# Patient Record
Sex: Female | Born: 1990 | Race: White | Hispanic: No | Marital: Single | Smoking: Never smoker
Health system: Southern US, Community
[De-identification: ages and names within clinical notes are randomized; demographics above are authoritative.]

## PROBLEM LIST (undated history)

## (undated) DIAGNOSIS — N3090 Cystitis, unspecified without hematuria: Secondary | ICD-10-CM

## (undated) DIAGNOSIS — F329 Major depressive disorder, single episode, unspecified: Secondary | ICD-10-CM

## (undated) DIAGNOSIS — F32A Depression, unspecified: Secondary | ICD-10-CM

## (undated) DIAGNOSIS — G43909 Migraine, unspecified, not intractable, without status migrainosus: Secondary | ICD-10-CM

## (undated) HISTORY — PX: APPENDECTOMY: SHX54

---

## 2009-11-12 ENCOUNTER — Emergency Department: Payer: Self-pay | Admitting: Emergency Medicine

## 2011-09-18 ENCOUNTER — Emergency Department: Payer: Self-pay | Admitting: *Deleted

## 2016-04-02 ENCOUNTER — Encounter (HOSPITAL_COMMUNITY): Payer: Self-pay

## 2016-04-02 ENCOUNTER — Emergency Department (HOSPITAL_COMMUNITY)
Admission: EM | Admit: 2016-04-02 | Discharge: 2016-04-02 | Disposition: A | Discharge status: Home / Self Care | Payer: BLUE CROSS/BLUE SHIELD | Attending: Emergency Medicine | Admitting: Emergency Medicine

## 2016-04-02 DIAGNOSIS — R51 Headache: Secondary | ICD-10-CM | POA: Diagnosis not present

## 2016-04-02 DIAGNOSIS — G43909 Migraine, unspecified, not intractable, without status migrainosus: Secondary | ICD-10-CM | POA: Diagnosis present

## 2016-04-02 DIAGNOSIS — R519 Headache, unspecified: Secondary | ICD-10-CM

## 2016-04-02 HISTORY — DX: Migraine, unspecified, not intractable, without status migrainosus: G43.909

## 2016-04-02 LAB — POC URINE PREG, ED: PREG TEST UR: NEGATIVE

## 2016-04-02 MED ORDER — DIPHENHYDRAMINE HCL 50 MG/ML IJ SOLN
25.0000 mg | Freq: Once | INTRAMUSCULAR | Status: AC
  Administered 2016-04-02: 25 mg via INTRAVENOUS
  Filled 2016-04-02: qty 1

## 2016-04-02 MED ORDER — METOCLOPRAMIDE HCL 5 MG/ML IJ SOLN
10.0000 mg | Freq: Once | INTRAMUSCULAR | Status: AC
  Administered 2016-04-02: 10 mg via INTRAVENOUS
  Filled 2016-04-02: qty 2

## 2016-04-02 MED ORDER — SODIUM CHLORIDE 0.9 % IV BOLUS (SEPSIS)
1000.0000 mL | Freq: Once | INTRAVENOUS | Status: AC
  Administered 2016-04-02: 1000 mL via INTRAVENOUS

## 2016-04-02 NOTE — ED Notes (Signed)
MD at bedside. 

## 2016-04-02 NOTE — ED Notes (Signed)
Patient here with complaint of migraine headache x 3 hours and nausea that started 1 hour ago. Hx of same. Denies trauma. Alert and oriented. Has some etoh last pm and thinks that may have precipitated the headache

## 2016-04-02 NOTE — ED Provider Notes (Signed)
CSN: [id number]     Arrival date & time 04/02/16  1558 History   First MD Initiated Contact with Patient 04/02/16 1700     Chief Complaint  Patient presents with  . Migraine    HPI Comments: Bl pulsatile throbbing frontal. Gradual in onset. Thinks she had an aura, knew she would  Has had migraines before.  +lightheaded.  No vertigo.  +photophobia and sound bothersome.  No fevers.  No neck stiffness.   Patient is a 25 y.o. female presenting with migraines and headaches. The history is provided by the patient.  Migraine This is a recurrent problem. The current episode started today (3 hrs ago). Associated symptoms include anorexia, headaches and vomiting (nausea resolving). Pertinent negatives include no abdominal pain, chest pain, chills, coughing, diaphoresis, fatigue, fever, nausea, neck pain, numbness, rash or visual change.  Headache Severity currently:  3/10 Severity at highest:  8/10 Onset quality:  Gradual Progression:  Improving Chronicity:  Recurrent Similar to prior headaches: yes   Associated symptoms: photophobia and vomiting (nausea resolving)   Associated symptoms: no abdominal pain, no back pain, no blurred vision, no cough, no diarrhea, no dizziness, no facial pain, no fatigue, no fever, no focal weakness, no nausea, no near-syncope, no neck pain, no neck stiffness, no numbness, no syncope, no tingling and no visual change    Had ETOH use last night.   Eats a lot of cheese.   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  No chocolate.  No trauma.   Has had 4 migraines in last year, 2 in last month.  More frequent panic attacks.   On estrogen OCP.     Past Medical History  Diagnosis Date  . Migraine    History reviewed. No pertinent past surgical history. No family history on file. Social History  Substance Use Topics  . Smoking status: Never Smoker   . Smokeless tobacco: None  . Alcohol Use: None   OB History    No data available     Review of Systems  Constitutional:  Negative for fever, chills, diaphoresis and fatigue.  Eyes: Positive for photophobia. Negative for blurred vision.  Respiratory: Negative for cough, shortness of breath and wheezing.   Cardiovascular: Negative for chest pain, syncope and near-syncope.  Gastrointestinal: Positive for vomiting (nausea resolving) and anorexia. Negative for nausea, abdominal pain and diarrhea.  Musculoskeletal: Negative for back pain, neck pain and neck stiffness.  Skin: Negative for rash.  Neurological: Positive for light-headedness and headaches. Negative for dizziness, focal weakness, speech difficulty and numbness.  All other systems reviewed and are negative.     Allergies  Review of patient's allergies indicates no known allergies.  Home Medications   Prior to Admission medications   Not on File   BP 108/72 mmHg  Pulse 68  Temp(Src) 98.2 F (36.8 C) (Oral)  Resp 18  Ht 5\' 8"  (1.727 m)  Wt 61.236 kg  BMI 20.53 kg/m2  SpO2 98%  LMP 03/26/2016 Physical Exam  Constitutional: She appears well-developed and well-nourished. No distress.  HENT:  Head: Normocephalic and atraumatic.  Nose: Nose normal.  Eyes: Conjunctivae are normal. Pupils are equal, round, and reactive to light.  Neck: Normal range of motion. Neck supple. No tracheal deviation present.  No nuchal rigidity  Cardiovascular: Normal rate, regular rhythm, normal heart sounds and intact distal pulses.   No murmur heard. Pulmonary/Chest: Effort normal and breath sounds normal. No respiratory distress. She has no rales.  Abdominal: Soft. Bowel sounds are normal. She exhibits  no distension and no mass. There is no tenderness. There is no guarding.  Musculoskeletal: Normal range of motion. She exhibits no edema or tenderness.  Neurological:  Alert and oriented x3. CN 3-12 tested and without deficit. 5/5 muscle strength in all extremities with flexion and extension.  Normal bulk and tone.  No sensory deficit to light touch.  Tandem gait  normal. Neg Brudzinski and Kernigs sign.  Symmetric and equal 2+ patellar and brachioradialis DTRs.  No pronator drift.  Normal heel-to-shin and finger-to-nose.  Toes flexor bilaterally.   Skin: Skin is warm. No rash noted.  Psychiatric: She has a normal mood and affect.    ED Course  Procedures (including critical care time) Labs Review Labs Reviewed  POC URINE PREG, ED    Imaging Review No results found. I have personally reviewed and evaluated these images and lab results as part of my medical decision-making.   EKG Interpretation None      MDM   Final diagnoses:  Nonintractable headache, unspecified chronicity pattern, unspecified headache type   Hx of migraines recently, has lots of food risk factors.  Neuro exam normal.  Doubt SAH, gradual onset, nml neuro exam. No fever, no rigidity, doubt meningitis.  Considered dural venous thrombosis given OCP but exam without any neuro deficit and HA improving.  Not sinusitis.   Not preg  Migraine cocktail resolved sx.  Remained very well appearing.  Counseled on strict return precautions.    Sofie Rower, MD 04/03/16 8119  Marily Memos, MD 04/03/16 [phone]

## 2017-02-21 ENCOUNTER — Other Ambulatory Visit: Payer: Self-pay | Admitting: Chiropractic Medicine

## 2017-02-21 DIAGNOSIS — R102 Pelvic and perineal pain: Secondary | ICD-10-CM

## 2017-03-01 ENCOUNTER — Ambulatory Visit
Admission: RE | Admit: 2017-03-01 | Discharge: 2017-03-01 | Disposition: A | Discharge status: Home / Self Care | Payer: BLUE CROSS/BLUE SHIELD | Source: Ambulatory Visit | Attending: Chiropractic Medicine | Admitting: Chiropractic Medicine

## 2017-03-01 DIAGNOSIS — R102 Pelvic and perineal pain: Secondary | ICD-10-CM

## 2017-04-18 ENCOUNTER — Ambulatory Visit (HOSPITAL_COMMUNITY)
Admission: EM | Admit: 2017-04-18 | Discharge: 2017-04-18 | Disposition: A | Discharge status: Home / Self Care | Payer: BLUE CROSS/BLUE SHIELD | Attending: Family Medicine | Admitting: Family Medicine

## 2017-04-18 ENCOUNTER — Encounter (HOSPITAL_COMMUNITY): Payer: Self-pay | Admitting: Emergency Medicine

## 2017-04-18 DIAGNOSIS — R300 Dysuria: Secondary | ICD-10-CM | POA: Insufficient documentation

## 2017-04-18 DIAGNOSIS — G43909 Migraine, unspecified, not intractable, without status migrainosus: Secondary | ICD-10-CM | POA: Insufficient documentation

## 2017-04-18 DIAGNOSIS — F329 Major depressive disorder, single episode, unspecified: Secondary | ICD-10-CM | POA: Insufficient documentation

## 2017-04-18 DIAGNOSIS — B373 Candidiasis of vulva and vagina: Secondary | ICD-10-CM | POA: Diagnosis not present

## 2017-04-18 DIAGNOSIS — B3731 Acute candidiasis of vulva and vagina: Secondary | ICD-10-CM

## 2017-04-18 DIAGNOSIS — N898 Other specified noninflammatory disorders of vagina: Secondary | ICD-10-CM | POA: Diagnosis present

## 2017-04-18 DIAGNOSIS — L298 Other pruritus: Secondary | ICD-10-CM | POA: Diagnosis present

## 2017-04-18 HISTORY — DX: Major depressive disorder, single episode, unspecified: F32.9

## 2017-04-18 HISTORY — DX: Depression, unspecified: F32.A

## 2017-04-18 LAB — POCT URINALYSIS DIP (DEVICE)
GLUCOSE, UA: NEGATIVE mg/dL
Ketones, ur: 15 mg/dL — AB
LEUKOCYTES UA: NEGATIVE
Nitrite: NEGATIVE
Protein, ur: 100 mg/dL — AB
SPECIFIC GRAVITY, URINE: 1.02 (ref 1.005–1.030)
UROBILINOGEN UA: 0.2 mg/dL (ref 0.0–1.0)
pH: 6.5 (ref 5.0–8.0)

## 2017-04-18 MED ORDER — FLUCONAZOLE 200 MG PO TABS: ORAL_TABLET | ORAL | 0 refills | Status: AC

## 2017-04-18 NOTE — Discharge Instructions (Signed)
Treating for yeast vaginitis, prescribed Diflucan, take one tablet today, wait 3 days and then take the second tablet. If symptoms persist return to clinic for further evaluation and management, or follow-up with her primary care provider.

## 2017-04-18 NOTE — ED Provider Notes (Signed)
CSN: [id number]     Arrival date & time 04/18/17  1020 History   First MD Initiated Contact with Patient 04/18/17 1137     Chief Complaint  Patient presents with  . Vaginitis   (Consider location/radiation/quality/duration/timing/severity/associated sxs/prior Treatment) The history is provided by the patient.  Vaginal Discharge  Quality:  Thick ("chunky") Severity:  Moderate Onset quality:  Gradual Duration:  3 days Timing:  Constant Progression:  Worsening Chronicity:  Recurrent Context: after urination and during urination   Context: not during intercourse and not recent antibiotic use   Relieved by:  None tried Ineffective treatments:  None tried Associated symptoms: dysuria and vaginal itching   Associated symptoms: no abdominal pain, no dyspareunia, no fever, no genital lesions, no nausea and no vomiting   Risk factors: unprotected sex   Risk factors: no new sexual partner, no STI and no STI exposure     Past Medical History:  Diagnosis Date  . Depression   . Migraine    History reviewed. No pertinent surgical history. History reviewed. No pertinent family history. Social History  Substance Use Topics  . Smoking status: Never Smoker  . Smokeless tobacco: Never Used  . Alcohol use Yes     Comment: occasional   OB History    No data available     Review of Systems  Constitutional: Negative for chills and fever.  HENT: Negative.   Respiratory: Negative.   Cardiovascular: Negative.   Gastrointestinal: Negative for abdominal pain, nausea and vomiting.  Genitourinary: Positive for dysuria and vaginal discharge. Negative for dyspareunia, frequency and pelvic pain.  Musculoskeletal: Negative.   Skin: Negative.   Neurological: Negative.     Allergies  Patient has no known allergies.  Home Medications   Prior to Admission medications   Medication Sig Start Date End Date Taking? Authorizing Provider  buPROPion (WELLBUTRIN XL) 300 MG 24 hr tablet Take 300 mg  by mouth daily.   Yes [provider]  drospirenone-ethinyl estradiol (YAZ,GIANVI,LORYNA) 3-0.02 MG tablet Take 1 tablet by mouth daily.   Yes [provider]  escitalopram (LEXAPRO) 20 MG tablet Take 20 mg by mouth daily.   Yes [provider]  fluconazole (DIFLUCAN) 200 MG tablet Take one tablet today, wait 3 days, take the second tablet 04/18/17   Dorena BodoKennard, Presly Steinruck, NP   Meds Ordered and Administered this Visit  Medications - No data to display  BP (!) 110/92 (BP Location: Right Arm)   Pulse 78   Temp 97.7 F (36.5 C) (Oral)   LMP 04/17/2017 (Exact Date)   SpO2 100%  No data found.   Physical Exam  Constitutional: She is oriented to person, place, and time. She appears well-developed and well-nourished. No distress.  HENT:  Head: Normocephalic and atraumatic.  Eyes: Conjunctivae are normal.  Neck: Normal range of motion.  Abdominal: Soft. There is no tenderness. There is no guarding.  Genitourinary:  Genitourinary Comments: Deferred at Pt request, urine cytology obtained  Neurological: She is alert and oriented to person, place, and time.  Skin: Skin is warm and dry. Capillary refill takes less than 2 seconds. She is not diaphoretic.  Psychiatric: She has a normal mood and affect. Her behavior is normal.  Nursing note and vitals reviewed.   Urgent Care Course     Procedures (including critical care time)  Labs Review Labs Reviewed  POCT URINALYSIS DIP (DEVICE) - Abnormal; Notable for the following:       Result Value   Bilirubin Urine SMALL (*)  Ketones, ur 15 (*)    Hgb urine dipstick TRACE (*)    Protein, ur 100 (*)    All other components within normal limits  URINE CYTOLOGY ANCILLARY ONLY    Imaging Review No results found.      MDM   1. Yeast vaginitis    Given prescription for Diflucan, urine cytology obtained and sent for testing. Return to clinic as necessary.    Dorena Bodo, NP 04/18/17 [phone]

## 2017-04-18 NOTE — ED Triage Notes (Signed)
Pt reports vaginal itching, burning, and discomfort along with clumpy white discharge for the last two days.  Pt states she gets yeast infections every few years with these same symptoms.

## 2017-04-19 LAB — URINE CYTOLOGY ANCILLARY ONLY
Chlamydia: NEGATIVE
NEISSERIA GONORRHEA: NEGATIVE
TRICH (WINDOWPATH): NEGATIVE

## 2017-04-23 LAB — URINE CYTOLOGY ANCILLARY ONLY
Bacterial vaginitis: NEGATIVE
Candida vaginitis: NEGATIVE

## 2017-05-01 ENCOUNTER — Ambulatory Visit (HOSPITAL_COMMUNITY)
Admission: EM | Admit: 2017-05-01 | Discharge: 2017-05-01 | Disposition: A | Discharge status: Home / Self Care | Payer: BLUE CROSS/BLUE SHIELD | Attending: Family Medicine | Admitting: Family Medicine

## 2017-05-01 ENCOUNTER — Encounter (HOSPITAL_COMMUNITY): Payer: Self-pay | Admitting: Family Medicine

## 2017-05-01 DIAGNOSIS — N3090 Cystitis, unspecified without hematuria: Secondary | ICD-10-CM | POA: Diagnosis not present

## 2017-05-01 HISTORY — DX: Cystitis, unspecified without hematuria: N30.90

## 2017-05-01 LAB — POCT URINALYSIS DIP (DEVICE)
Bilirubin Urine: NEGATIVE
Glucose, UA: NEGATIVE mg/dL
HGB URINE DIPSTICK: NEGATIVE
KETONES UR: NEGATIVE mg/dL
Nitrite: NEGATIVE
PH: 7 (ref 5.0–8.0)
PROTEIN: 30 mg/dL — AB
SPECIFIC GRAVITY, URINE: 1.015 (ref 1.005–1.030)
Urobilinogen, UA: 0.2 mg/dL (ref 0.0–1.0)

## 2017-05-01 MED ORDER — CIPROFLOXACIN HCL 500 MG PO TABS: 500.0000 mg | ORAL_TABLET | Freq: Two times a day (BID) | ORAL | 0 refills | Status: AC

## 2017-05-01 NOTE — ED Triage Notes (Signed)
Woke   Up      Symptoms    Fullness      Frequency  Scanty  Output     Of urine   Has  Had   uti  In  Past

## 2017-05-01 NOTE — ED Provider Notes (Signed)
[facility]    CSN: [id number] Arrival date & time: 05/01/17  1113     History   Chief Complaint Chief Complaint  Patient presents with  . Urinary Tract Infection    HPI Kathleen Ayers is a 26 y.o. female.   This 26 year old woman comes to the urgent care center complaining of symptoms she attributes to a urinary tract infection.  Patient has a history of inflammatory cystitis in the past which she was treated for an largely resolve. She still gets an occasional urinary infection and when these symptoms occur, she probably seeks help because she knows it'll get worse rapidly.  She had a yeast infection a couple weeks ago and it was successfully treated. She is not married but she is sexually active. She's nulliparous.      Past Medical History:  Diagnosis Date  . Cystitis   . Depression   . Migraine     There are no active problems to display for this patient.   Past Surgical History:  Procedure Laterality Date  . APPENDECTOMY      OB History    No data available       Home Medications    Prior to Admission medications   Medication Sig Start Date End Date Taking? Authorizing Provider  buPROPion (WELLBUTRIN XL) 300 MG 24 hr tablet Take 300 mg by mouth daily.    [provider]  ciprofloxacin (CIPRO) 500 MG tablet Take 1 tablet (500 mg total) by mouth 2 (two) times daily. 05/01/17   Elvina Sidle, MD  drospirenone-ethinyl estradiol Pierre Bali) 3-0.02 MG tablet Take 1 tablet by mouth daily.    [provider]  escitalopram (LEXAPRO) 20 MG tablet Take 20 mg by mouth daily.    [provider]  fluconazole (DIFLUCAN) 200 MG tablet Take one tablet today, wait 3 days, take the second tablet 04/18/17   Dorena Bodo, NP    Family History History reviewed. No pertinent family history.  Social History Social History  Substance Use Topics  . Smoking status: Never Smoker  . Smokeless tobacco: Never Used  .  Alcohol use Yes     Comment: occasional     Allergies   Patient has no known allergies.   Review of Systems Review of Systems  Genitourinary: Positive for dysuria and frequency.     Physical Exam Triage Vital Signs ED Triage Vitals  Enc Vitals Group     BP      Pulse      Resp      Temp      Temp src      SpO2      Weight      Height      Head Circumference      Peak Flow      Pain Score      Pain Loc      Pain Edu?      Excl. in GC?    No data found.   Updated Vital Signs BP 110/70 (BP Location: Right Arm)   Pulse 78   Temp 98.6 F (37 C) (Oral)   Resp 18   LMP 04/15/2017   SpO2 100%      Physical Exam  Constitutional: She is oriented to person, place, and time. She appears well-developed and well-nourished.  HENT:  Right Ear: External ear normal.  Left Ear: External ear normal.  Mouth/Throat: Oropharynx is clear and moist.  Eyes: Conjunctivae and EOM are normal. Pupils are equal, round,  and reactive to light.  Neck: Normal range of motion. Neck supple.  Pulmonary/Chest: Effort normal.  Musculoskeletal: Normal range of motion.  Neurological: She is alert and oriented to person, place, and time.  Skin: Skin is warm and dry.  Psychiatric: She has a normal mood and affect. Her behavior is normal. Thought content normal.  Nursing note and vitals reviewed.    UC Treatments / Results  Labs (all labs ordered are listed, but only abnormal results are displayed) Labs Reviewed  POCT URINALYSIS DIP (DEVICE) - Abnormal; Notable for the following:       Result Value   Protein, ur 30 (*)    Leukocytes, UA MODERATE (*)    All other components within normal limits    EKG  EKG Interpretation None       Radiology No results found.  Procedures Procedures (including critical care time)  Medications Ordered in UC Medications - No data to display   Initial Impression / Assessment and Plan / UC Course  I have reviewed the triage vital signs and  the nursing notes.  Pertinent labs & imaging results that were available during my care of the patient were reviewed by me and considered in my medical decision making (see chart for details).     Final Clinical Impressions(s) / UC Diagnoses   Final diagnoses:  Cystitis    New Prescriptions New Prescriptions   CIPROFLOXACIN (CIPRO) 500 MG TABLET    Take 1 tablet (500 mg total) by mouth 2 (two) times daily.     Elvina SidleLauenstein, Geddy Boydstun, MD 05/01/17 1215

## 2019-01-31 IMAGING — US US PELVIS COMPLETE
1 series · 14 of 25 positions shown · non-contrast
Comparison: None

CLINICAL DATA: Chronic severe dysmenorrhea.

EXAM:
TRANSABDOMINAL AND TRANSVAGINAL ULTRASOUND OF PELVIS
TECHNIQUE: Both transabdominal and transvaginal ultrasound examinations of the
pelvis were performed. Transabdominal technique was performed for
global imaging of the pelvis including uterus, ovaries, adnexal
regions, and pelvic cul-de-sac. It was necessary to proceed with
endovaginal exam following the transabdominal exam to visualize the
endometrial stripe and ovaries.

[Series 1: us pelvis complete · 0.23mm/px · 14 of 41 slices shown]
[im 1/41]
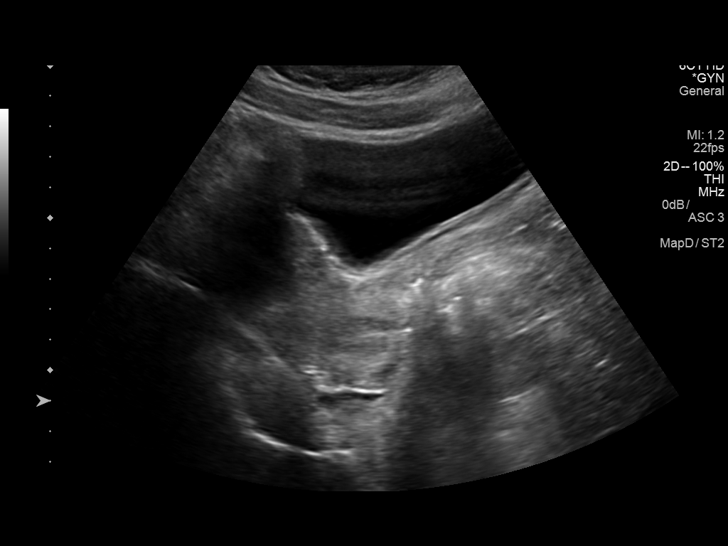
[im 4/41]
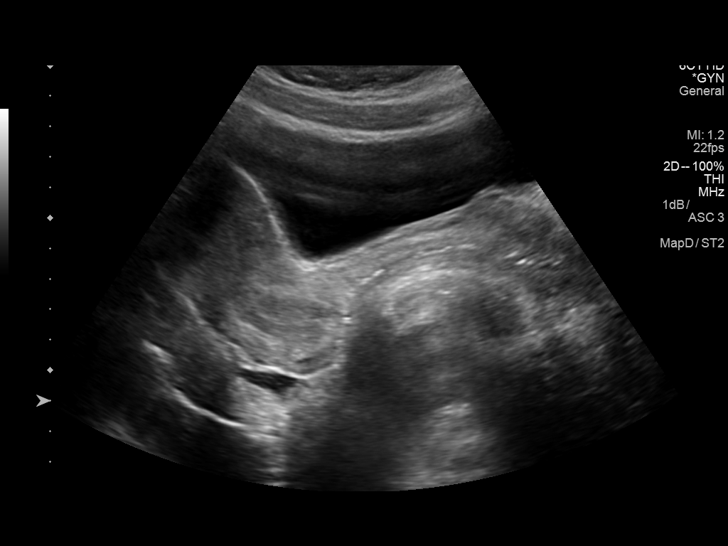
[im 7/41]
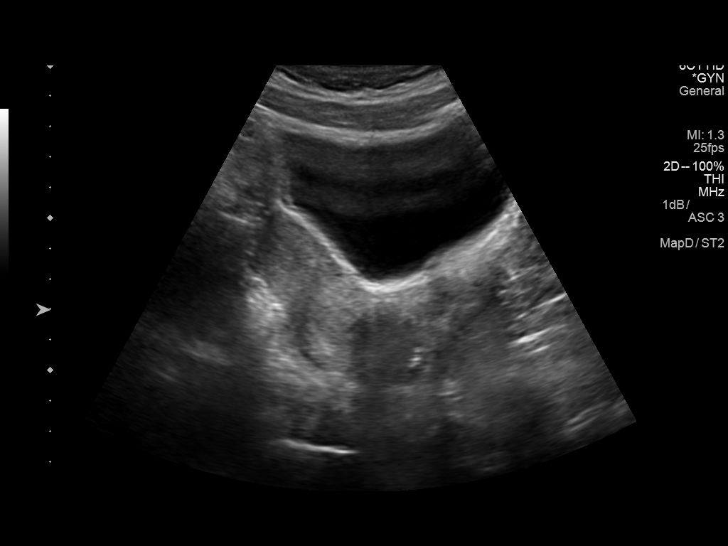
[im 11/41]
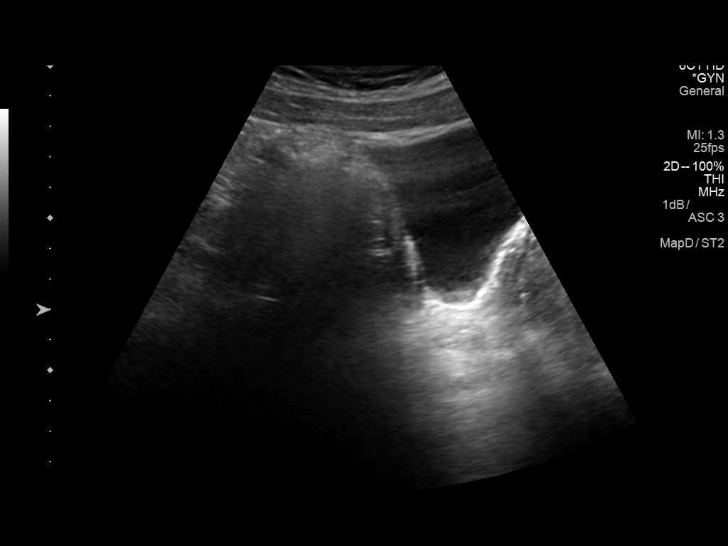
[im 14/41]
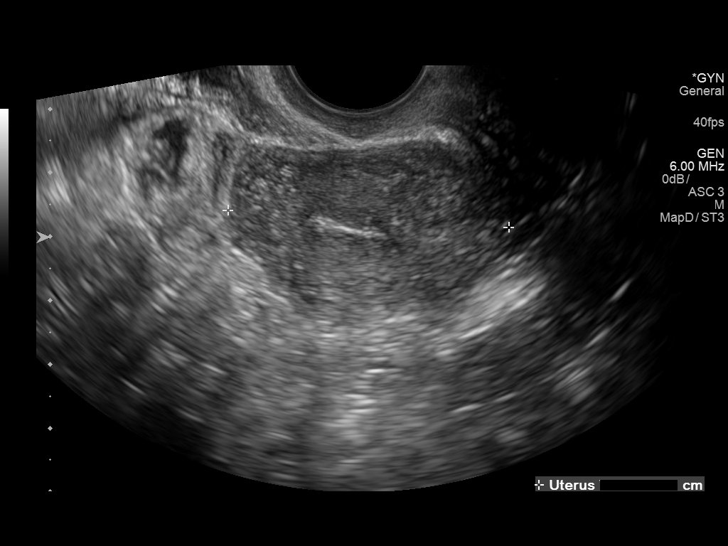
[im 16/41]
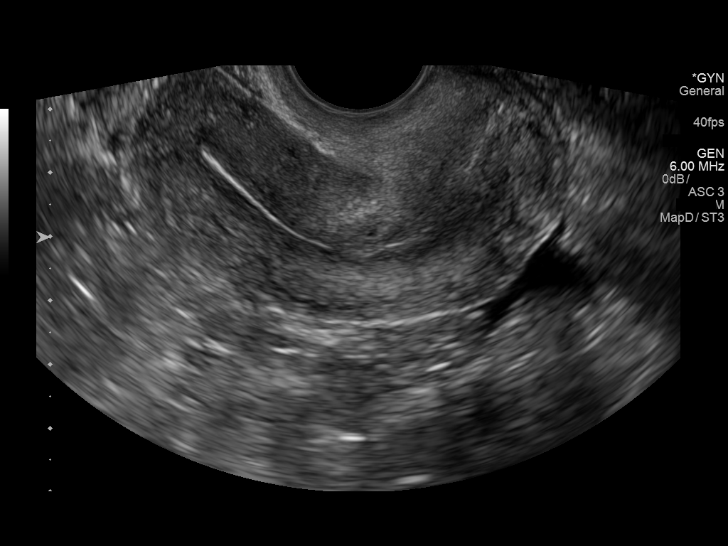
[im 19/41]
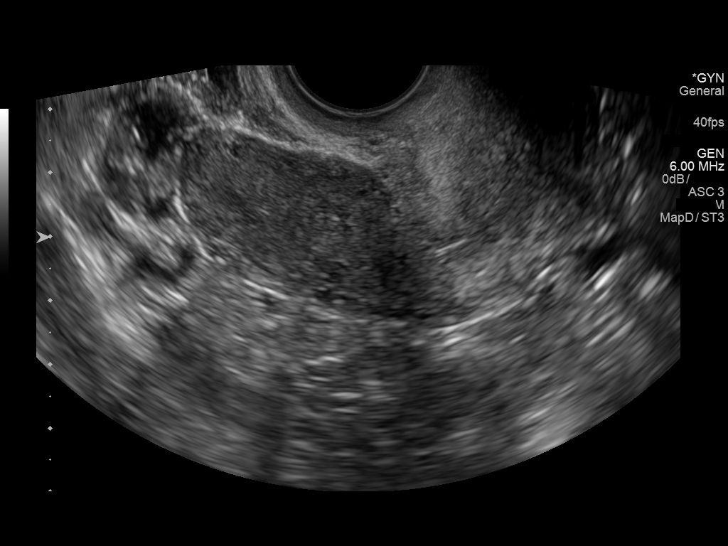
[im 22/41]
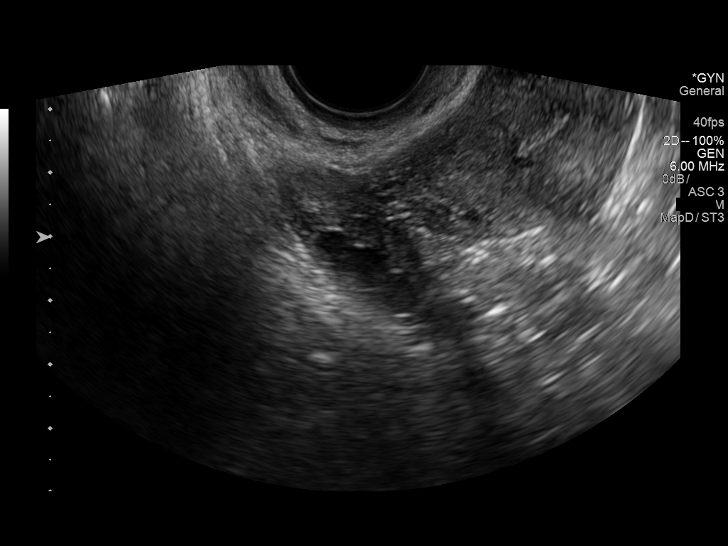
[im 26/41]
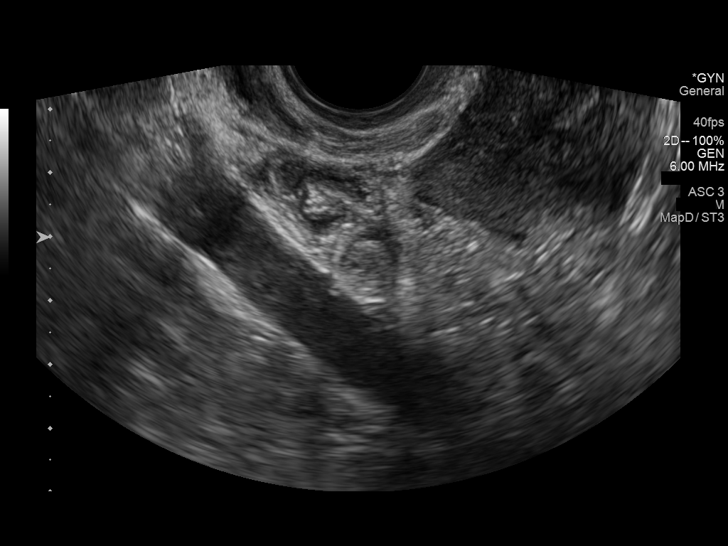
[im 27/41]
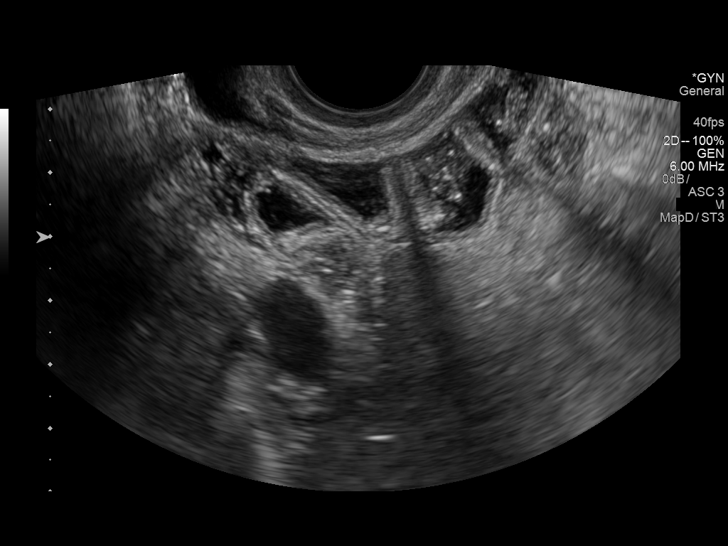
[im 31/41]
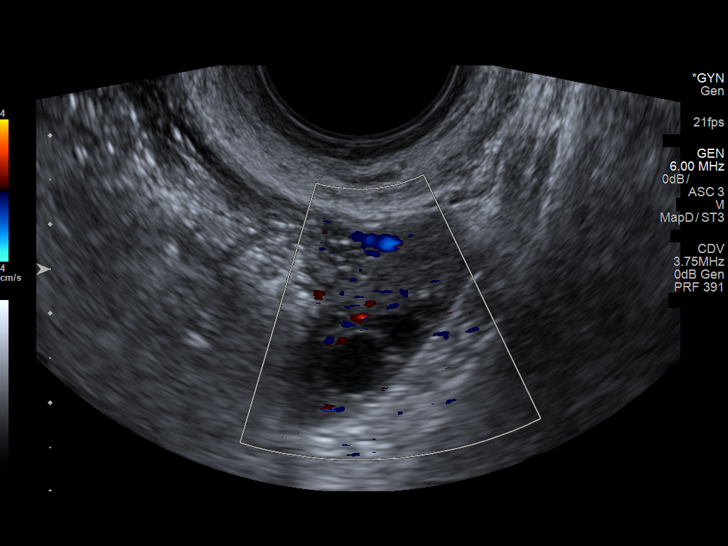
[im 34/41]
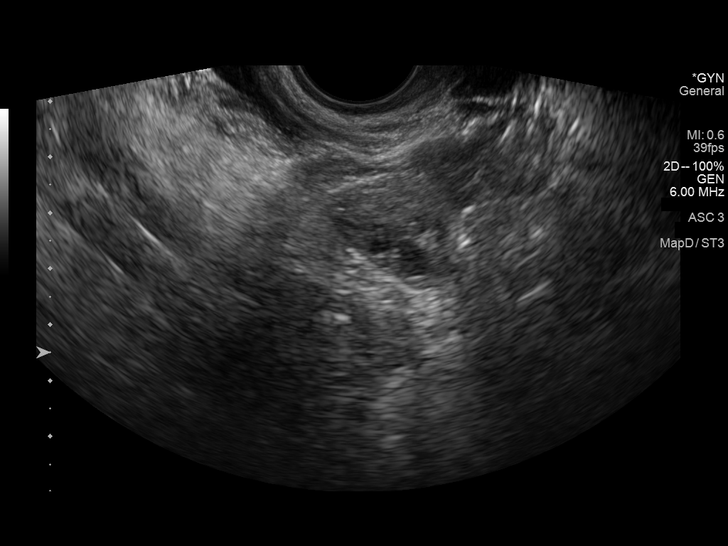
[im 37/41]
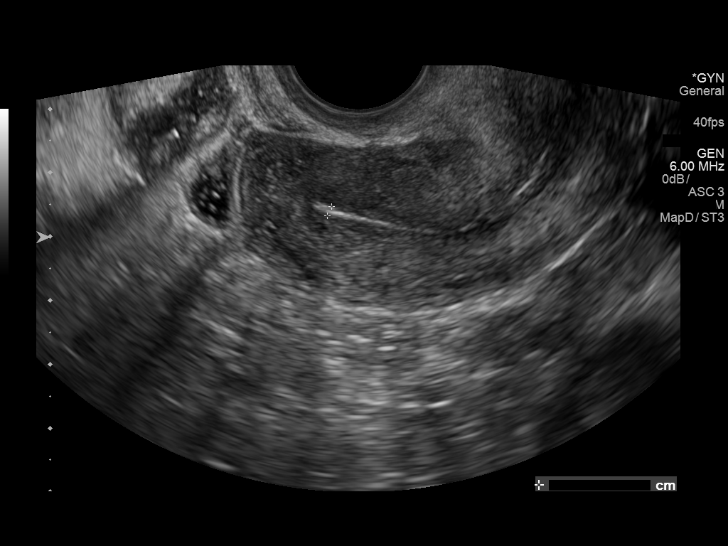
[im 41/41]
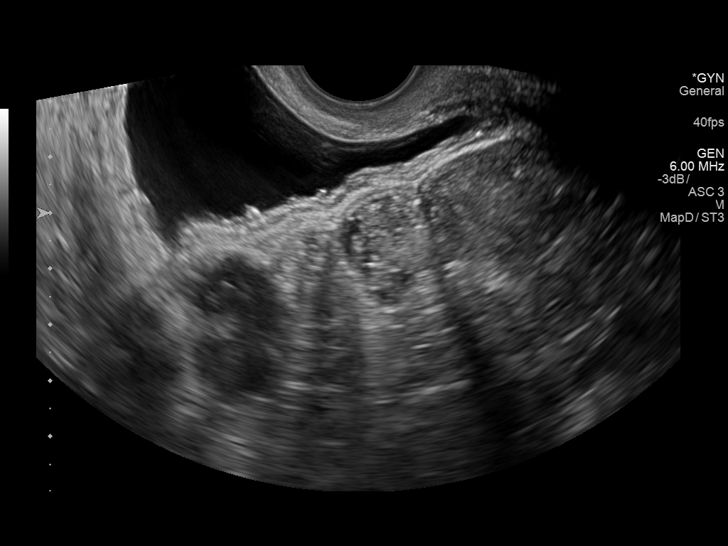

[14 of 25 positions shown; findings below may reference images not displayed]

FINDINGS: Uterus

Measurements: 7.7 x 3.3 x 4.4 cm. No fibroids or other mass
visualized.

Endometrium

Thickness: 2 mm.  No focal abnormality visualized.

Right ovary

Measurements: 3.1 x 1.4 x 2.4 cm. Normal appearance/no adnexal mass.

Left ovary

Measurements: 3.3 x 1.5 x 1.7 cm. Normal appearance/no adnexal mass.

Other findings

No abnormal free fluid.
IMPRESSION: Normal appearance of uterus and ovaries. No pelvic mass or other
significant abnormality identified.
